# Patient Record
Sex: Female | Born: 1955 | Hispanic: Yes | Smoking: Never smoker
Health system: Southern US, Community
[De-identification: ages and names within clinical notes are randomized; demographics above are authoritative.]

## PROBLEM LIST (undated history)

## (undated) DIAGNOSIS — I10 Essential (primary) hypertension: Secondary | ICD-10-CM

## (undated) HISTORY — PX: ABDOMINAL SURGERY: SHX537

---

## 2010-01-23 ENCOUNTER — Ambulatory Visit: Payer: Self-pay | Admitting: Family

## 2010-01-27 ENCOUNTER — Ambulatory Visit: Payer: Self-pay | Admitting: Family

## 2010-08-07 ENCOUNTER — Ambulatory Visit: Payer: Self-pay | Admitting: Family

## 2013-04-03 ENCOUNTER — Ambulatory Visit: Payer: Self-pay | Admitting: Primary Care

## 2014-06-10 ENCOUNTER — Emergency Department: Payer: Self-pay | Admitting: Emergency Medicine

## 2014-06-10 LAB — URINALYSIS, COMPLETE
BILIRUBIN, UR: NEGATIVE
BLOOD: NEGATIVE
GLUCOSE, UR: NEGATIVE mg/dL (ref 0–75)
Ketone: NEGATIVE
Nitrite: NEGATIVE
PH: 5 (ref 4.5–8.0)
Protein: NEGATIVE
Specific Gravity: 1.009 (ref 1.003–1.030)
Squamous Epithelial: NONE SEEN

## 2014-06-10 LAB — CBC
HCT: 44.1 % (ref 35.0–47.0)
HGB: 14.4 g/dL (ref 12.0–16.0)
MCH: 29.4 pg (ref 26.0–34.0)
MCHC: 32.6 g/dL (ref 32.0–36.0)
MCV: 90 fL (ref 80–100)
Platelet: 289 10*3/uL (ref 150–440)
RBC: 4.9 10*6/uL (ref 3.80–5.20)
RDW: 13.6 % (ref 11.5–14.5)
WBC: 15.3 10*3/uL — ABNORMAL HIGH (ref 3.6–11.0)

## 2014-06-10 LAB — BASIC METABOLIC PANEL
ANION GAP: 8 (ref 7–16)
BUN: 15 mg/dL (ref 7–18)
CALCIUM: 9.4 mg/dL (ref 8.5–10.1)
CHLORIDE: 104 mmol/L (ref 98–107)
CO2: 26 mmol/L (ref 21–32)
Creatinine: 0.79 mg/dL (ref 0.60–1.30)
EGFR (African American): >60
GLUCOSE: 142 mg/dL — AB (ref 65–99)
Osmolality: 279 (ref 275–301)
POTASSIUM: 3.7 mmol/L (ref 3.5–5.1)
SODIUM: 138 mmol/L (ref 136–145)

## 2016-02-18 ENCOUNTER — Other Ambulatory Visit: Payer: Self-pay | Admitting: Internal Medicine

## 2016-02-18 DIAGNOSIS — Z1239 Encounter for other screening for malignant neoplasm of breast: Secondary | ICD-10-CM

## 2016-03-03 ENCOUNTER — Ambulatory Visit: Payer: BLUE CROSS/BLUE SHIELD

## 2016-03-05 ENCOUNTER — Ambulatory Visit
Admission: RE | Admit: 2016-03-05 | Discharge: 2016-03-05 | Disposition: A | Discharge status: Home / Self Care | Payer: BLUE CROSS/BLUE SHIELD | Source: Ambulatory Visit | Attending: Internal Medicine | Admitting: Internal Medicine

## 2016-03-05 DIAGNOSIS — Z1231 Encounter for screening mammogram for malignant neoplasm of breast: Secondary | ICD-10-CM | POA: Insufficient documentation

## 2016-03-05 DIAGNOSIS — Z1239 Encounter for other screening for malignant neoplasm of breast: Secondary | ICD-10-CM

## 2016-03-10 ENCOUNTER — Other Ambulatory Visit: Payer: Self-pay | Admitting: Internal Medicine

## 2016-03-10 DIAGNOSIS — R928 Other abnormal and inconclusive findings on diagnostic imaging of breast: Secondary | ICD-10-CM

## 2016-03-24 ENCOUNTER — Ambulatory Visit
Admission: RE | Admit: 2016-03-24 | Discharge: 2016-03-24 | Disposition: A | Discharge status: Home / Self Care | Payer: BLUE CROSS/BLUE SHIELD | Source: Ambulatory Visit | Attending: Internal Medicine | Admitting: Internal Medicine

## 2016-03-24 DIAGNOSIS — N6489 Other specified disorders of breast: Secondary | ICD-10-CM | POA: Diagnosis present

## 2016-03-24 DIAGNOSIS — R928 Other abnormal and inconclusive findings on diagnostic imaging of breast: Secondary | ICD-10-CM

## 2016-05-02 ENCOUNTER — Emergency Department
Admission: EM | Admit: 2016-05-02 | Discharge: 2016-05-03 | Disposition: A | Discharge status: Home / Self Care | Payer: BLUE CROSS/BLUE SHIELD | Attending: Emergency Medicine | Admitting: Emergency Medicine

## 2016-05-02 ENCOUNTER — Encounter: Payer: Self-pay | Admitting: Emergency Medicine

## 2016-05-02 DIAGNOSIS — R109 Unspecified abdominal pain: Secondary | ICD-10-CM | POA: Diagnosis not present

## 2016-05-02 DIAGNOSIS — Z79899 Other long term (current) drug therapy: Secondary | ICD-10-CM | POA: Insufficient documentation

## 2016-05-02 DIAGNOSIS — I10 Essential (primary) hypertension: Secondary | ICD-10-CM | POA: Diagnosis not present

## 2016-05-02 HISTORY — DX: Essential (primary) hypertension: I10

## 2016-05-02 NOTE — ED Notes (Addendum)
Interpreter present; pt says symptoms started around 2pm today; she vomited once around 2130; pt now says not really diarrhea, just gas; pt says abd pain is generalized colic type pain; intermittent; pt says no pain at present; when pain is at it's worst she rates it 7/10

## 2016-05-02 NOTE — ED Notes (Signed)
Pt talking with interpreter-not wanting to have blood drawn to see what could be possibly causing her symptoms; interpreter trying to explain to pt why blood work is needed; pt reluctant to have blood drawn but now understands why it's needed

## 2016-05-02 NOTE — ED Notes (Signed)
Spent several minutes discussing blood draw with pt. Pt consents to blood draw. After piercing pt's skin with needle for venipuncture, pt states "i don't want it anymore, i want to go home now." no blood work obtained, dr. Darnelle Catalanmalinda notified.

## 2016-05-02 NOTE — ED Notes (Addendum)
At patient's request, submitted request for interpreter for triage; pt presents with N/V/D and abd pain; given mask to wear

## 2016-05-03 MED ORDER — ONDANSETRON 4 MG PO TBDP
4.0000 mg | ORAL_TABLET | Freq: Once | ORAL | Status: AC
  Administered 2016-05-03: 4 mg via ORAL
  Filled 2016-05-03: qty 1

## 2016-05-03 NOTE — ED Provider Notes (Signed)
[facility] Emergency Department Provider Note   ____________________________________________  Time seen: Approximately 12:19 AM  I have reviewed the triage vital signs and the nursing notes.   HISTORY  Chief Complaint Abdominal Pain and Emesis    HPI Susan Duarte is a 60 y.o. female Comes in not wanting blood work x-rays or CT scan to talk her into getting blood work within limits at up with the IV and patient refuses againno explained to try to get blood work and the [employer] patient then provides me the history through the interpreter that she has crampy abdominal pain and vomited once, has a lot of gas and a little bit of diarrhea today. yesterday she was fine she is not running a fever she took 1 tablespoon of Pepto-Bismol and feltbetter afterwards but she is not quite back to normal. I discussed with her since her belly is not tender she is not vomiting anymore not weak or lightheaded that she can go back home take 2 more tablespoons of Pepto-Bismol and drank some water see what happens tonight. She is to return if she has worsening of her abdominal pain further vomiting diarrhea pain localizes somewhere in her abdomenshe feels weak sicker or in any way worse. Patient is okay with this still does not want an IV or CT scan I agree not to do any of these as long as she understands that she is to return if she feels worse at all and she is to expect that we will do much more including possibly CT scan and IV if she does come back.She agrees to this.     Past Medical History  Diagnosis Date  . Hypertension     There are no active problems to display for this patient.   Past Surgical History  Procedure Laterality Date  . Abdominal surgery    . Cesarean section      Current Outpatient Rx  Name  Route  Sig  Dispense  Refill  . lisinopril (PRINIVIL,ZESTRIL) 10 MG tablet   Oral   Take 10 mg by mouth daily.           Allergies Ivp dye  Family  History  Problem Relation Age of Onset  . Breast cancer Neg Hx     Social History Social History  Substance Use Topics  . Smoking status: Never Smoker   . Smokeless tobacco: None  . Alcohol Use: No    Review of Systems  Constitutional: No fever/chills Eyes: No visual changes. ENT: No sore throat. Cardiovascular: Denies chest pain. Respiratory: Denies shortness of breath. Gastrointestinal:ee history of present illness Genitourinary: Negative for dysuria. Musculoskeletal: Negative for back pain. Skin: Negative for rash. Neurological: Negative for headaches, focal weakness or numbness.  10-point ROS otherwise negative.  ____________________________________________   PHYSICAL EXAM:  VITAL SIGNS: ED Triage Vitals  Enc Vitals Group     BP 05/02/16 2315 169/77 mmHg     Pulse Rate 05/02/16 2315 99     Resp 05/02/16 2315 18     Temp 05/02/16 2315 98.5 F (36.9 C)     Temp Source 05/02/16 2315 Oral     SpO2 05/02/16 2315 97 %     Weight 05/02/16 2315 117 lb (53.071 kg)     Height 05/02/16 2315 4\' 11"  (1.499 m)     Head Cir --      Peak Flow --      Pain Score 05/02/16 2315 0     Pain Loc --  Pain Edu? --      Excl. in GC? --     Constitutional: Alert and oriented. Well appearing and in no acute distress. Eyes: Conjunctivae are normal. PERRL. EOMI. Head: Atraumatic. Nose: No congestion/rhinnorhea. Mouth/Throat: Mucous membranes are moist.  Oropharynx non-erythematous. Neck: No stridor Cardiovascular:   Good peripheral circulation. Respiratory: Normal respiratory effort.  No retractions. Gastrointestinal: Soft and nontender. No distention. No abdominal bruits. No CVA tenderness. Musculoskeletal: No lower extremity tenderness nor edema.   Neurologic:  Normal speech and language. No gross focal neurologic deficits are appreciated. No gait instability. Skin:  Skin is warm, dry and intact. No rash noted. Psychiatric: Mood and affect are normal. Speech and behavior  are normal.  ____________________________________________   LABS (all labs ordered are listed, but only abnormal results are displayed)  Labs Reviewed - No data to display ____________________________________________  EKG   ____________________________________________  RADIOLOGY   ____________________________________________   PROCEDURES    ____________________________________________   INITIAL IMPRESSION / ASSESSMENT AND PLAN / ED COURSE  Pertinent labs & imaging results that were available during my care of the patient were reviewed by me and considered in my medical decision making (see chart for details).   ____________________________________________   FINAL CLINICAL IMPRESSION(S) / ED DIAGNOSES  Final diagnoses:  Abdominal pain, unspecified abdominal location      NEW MEDICATIONS STARTED DURING THIS VISIT:  Discharge Medication List as of 05/03/2016 12:25 AM       Note:  This document was prepared using Dragon voice recognition software and may include unintentional dictation errors.    Arnaldo NatalPaul F Malinda, MD 05/03/16 [phone]

## 2016-05-03 NOTE — Discharge Instructions (Signed)
Dolor abdominal en adultos (Abdominal Pain, Adult) El dolor puede tener muchas causas. Normalmente la causa del dolor abdominal no es una enfermedad y Scientist, clinical (histocompatibility and immunogenetics)mejorar sin TEFL teachertratamiento. Frecuentemente puede controlarse y tratarse en casa. Su mdico le Medical sales representativerealizar un examen fsico y posiblemente solicite anlisis de sangre y radiografas para ayudar a Chief Strategy Officerdeterminar la gravedad de su dolor. Sin embargo, en IAC/InterActiveCorpmuchos casos, debe transcurrir ms tiempo antes de que se pueda Clinical research associateencontrar una causa evidente del dolor. Antes de llegar a ese punto, es posible que su mdico no sepa si necesita ms pruebas o un tratamiento ms profundo. INSTRUCCIONES PARA EL CUIDADO EN EL HOGAR  Est atento al dolor para ver si hay cambios. Las siguientes indicaciones ayudarn a Architectural technologistaliviar cualquier molestia que pueda sentir:  Bramanome solo medicamentos de venta libre o recetados, segn las indicaciones del mdico.  No tome laxantes a menos que se lo haya indicado su mdico.  Pruebe con Neomia Dearuna dieta lquida absoluta (caldo, t o agua) segn se lo indique su mdico. Introduzca gradualmente una dieta normal, segn su tolerancia. SOLICITE ATENCIN MDICA SI:  Tiene dolor abdominal sin explicacin.  Tiene dolor abdominal relacionado con nuseas o diarrea.  Tiene dolor cuando orina o defeca.  Experimenta dolor abdominal que lo despierta de noche.  Tiene dolor abdominal que empeora o mejora cuando come alimentos.  Tiene dolor abdominal que empeora cuando come alimentos grasosos.  Tiene fiebre. SOLICITE ATENCIN MDICA DE INMEDIATO SI:   El dolor no desaparece en un plazo mximo de 2horas.  No deja de (vomitar).  El Engineer, miningdolor se siente solo en partes del abdomen, como el lado derecho o la parte inferior izquierda del abdomen.  Evaca materia fecal sanguinolenta o negra, de aspecto alquitranado. ASEGRESE DE QUE:  Comprende estas instrucciones.  Controlar su afeccin.  Recibir ayuda de inmediato si no mejora o si empeora.   Esta  informacin no tiene Theme park managercomo fin reemplazar el consejo del mdico. Asegrese de hacerle al mdico cualquier pregunta que tenga.   Document Released: 10/18/2005 Document Revised: 11/08/2014 Elsevier Interactive Patient Education [employer]2016 [employer] Inc.   Please take 2 more tablespoons of Pepto-Bismol at home. Make sure you drink a little bit of water afterwards to make it Pepto-Bismol out of your mouth. Remember the Pepto-Bismol can make your stool black. Please return for fever or vomiting worse pain or localization of the pain to any one part of the abdomen area please return if you feel worse at all. Remember if she do come back we will need to get more tests and chest physical exam.

## 2017-04-20 ENCOUNTER — Other Ambulatory Visit: Payer: Self-pay | Admitting: Obstetrics and Gynecology

## 2017-04-20 DIAGNOSIS — N6001 Solitary cyst of right breast: Secondary | ICD-10-CM

## 2017-05-27 ENCOUNTER — Ambulatory Visit
Admission: RE | Admit: 2017-05-27 | Discharge: 2017-05-27 | Disposition: A | Discharge status: Home / Self Care | Payer: BLUE CROSS/BLUE SHIELD | Source: Ambulatory Visit | Attending: Obstetrics and Gynecology | Admitting: Obstetrics and Gynecology

## 2017-05-27 DIAGNOSIS — N6001 Solitary cyst of right breast: Secondary | ICD-10-CM

## 2021-07-07 ENCOUNTER — Other Ambulatory Visit: Payer: Self-pay | Admitting: Internal Medicine

## 2021-07-07 DIAGNOSIS — Z1231 Encounter for screening mammogram for malignant neoplasm of breast: Secondary | ICD-10-CM

## 2021-08-05 LAB — COLOGUARD

## 2022-02-12 ENCOUNTER — Other Ambulatory Visit: Payer: Self-pay | Admitting: Internal Medicine

## 2022-02-12 DIAGNOSIS — Z1231 Encounter for screening mammogram for malignant neoplasm of breast: Secondary | ICD-10-CM

## 2022-04-02 ENCOUNTER — Ambulatory Visit
Admission: RE | Admit: 2022-04-02 | Discharge: 2022-04-02 | Disposition: A | Discharge status: Home / Self Care | Payer: Medicare Other | Source: Ambulatory Visit | Attending: Internal Medicine | Admitting: Internal Medicine

## 2022-04-02 DIAGNOSIS — Z1231 Encounter for screening mammogram for malignant neoplasm of breast: Secondary | ICD-10-CM | POA: Diagnosis not present

## 2024-02-03 DIAGNOSIS — E119 Type 2 diabetes mellitus without complications: Secondary | ICD-10-CM | POA: Diagnosis not present

## 2024-02-10 DIAGNOSIS — I10 Essential (primary) hypertension: Secondary | ICD-10-CM | POA: Diagnosis not present

## 2024-02-10 DIAGNOSIS — E7800 Pure hypercholesterolemia, unspecified: Secondary | ICD-10-CM | POA: Diagnosis not present

## 2024-02-10 DIAGNOSIS — E119 Type 2 diabetes mellitus without complications: Secondary | ICD-10-CM | POA: Diagnosis not present

## 2024-02-10 IMAGING — MG MM DIGITAL SCREENING BILAT W/ TOMO AND CAD
8 series · 8 of 24 positions shown · non-contrast
Comparison: Previous exam(s).

CLINICAL DATA: Screening.

EXAM:
DIGITAL SCREENING BILATERAL MAMMOGRAM WITH TOMOSYNTHESIS AND CAD
TECHNIQUE: Bilateral screening digital craniocaudal and mediolateral oblique
mammograms were obtained. Bilateral screening digital breast
tomosynthesis was performed. The images were evaluated with
computer-aided detection.

[L CC synth-2D]
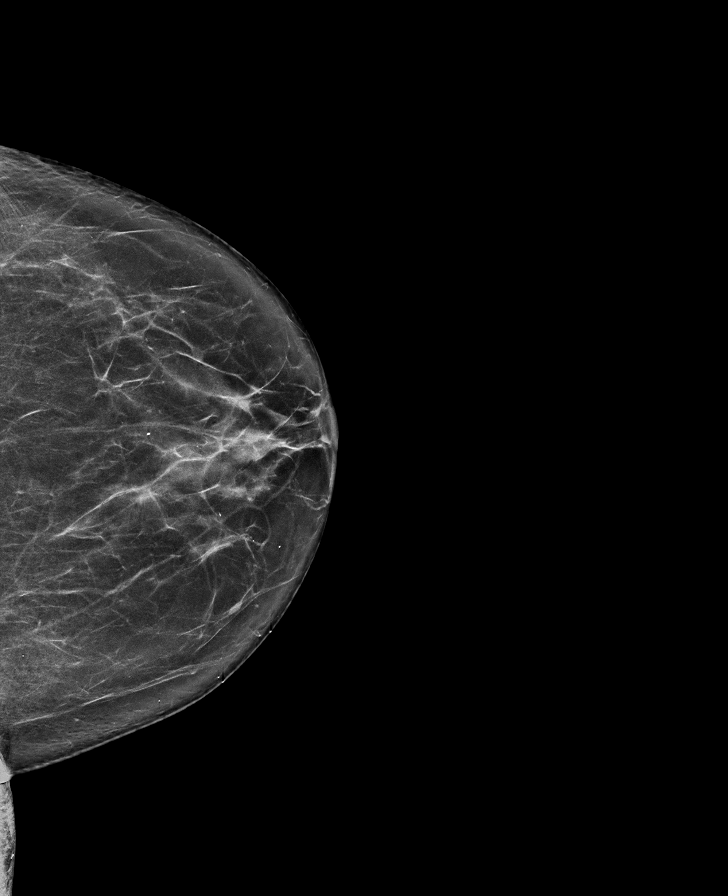

[R CC synth-2D]
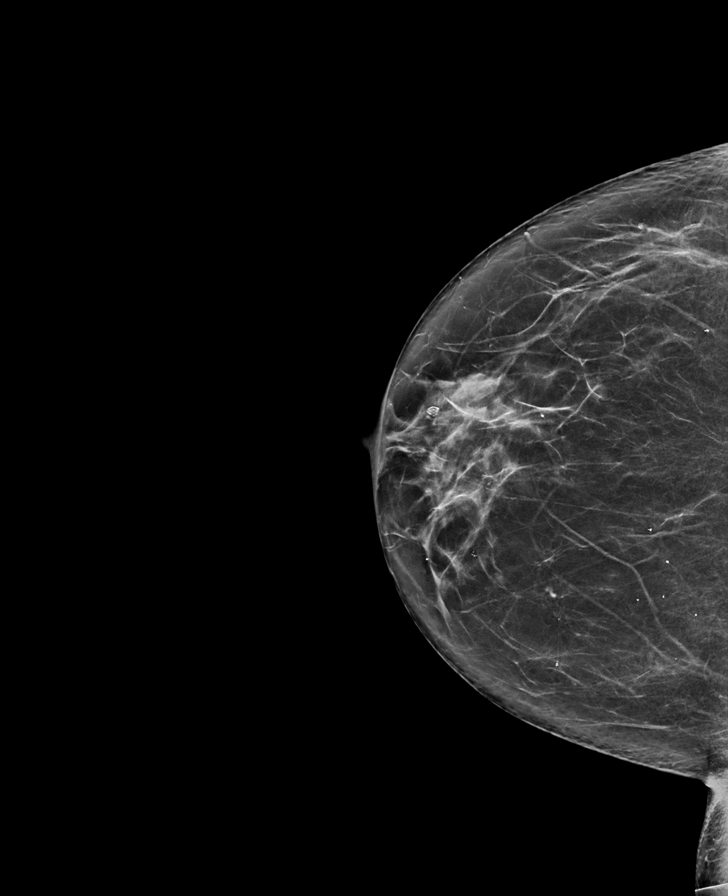

[R MLO synth-2D]
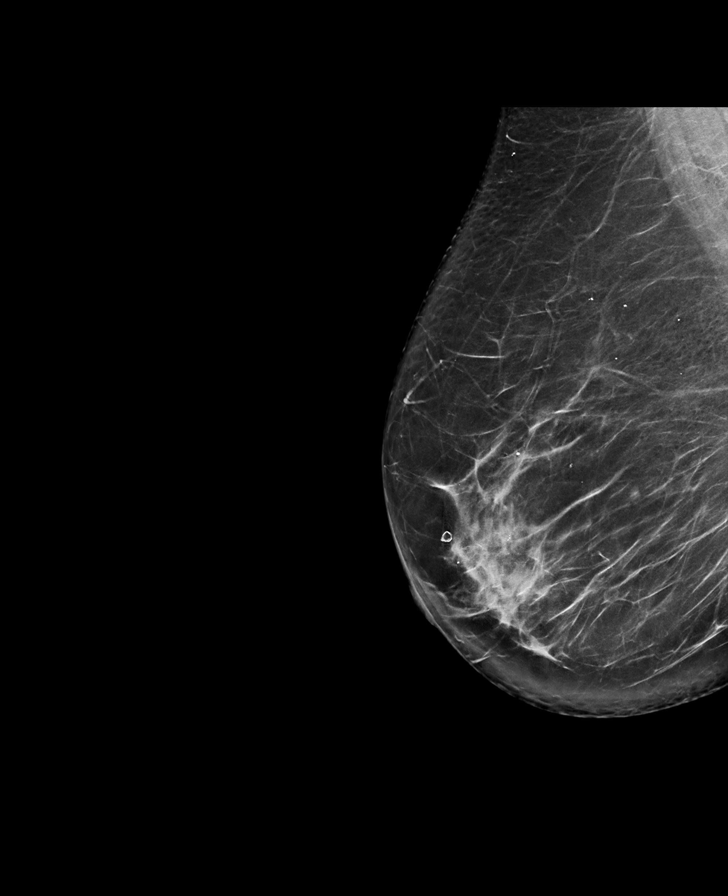

[L MLO synth-2D]
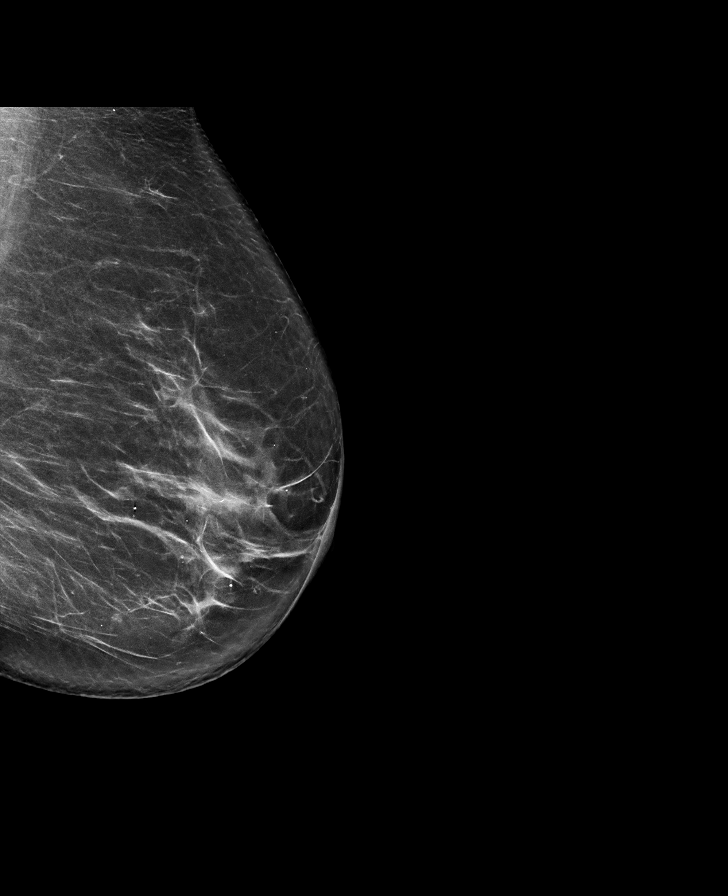

[R MLO tomo · tomo slice 41/82.0]
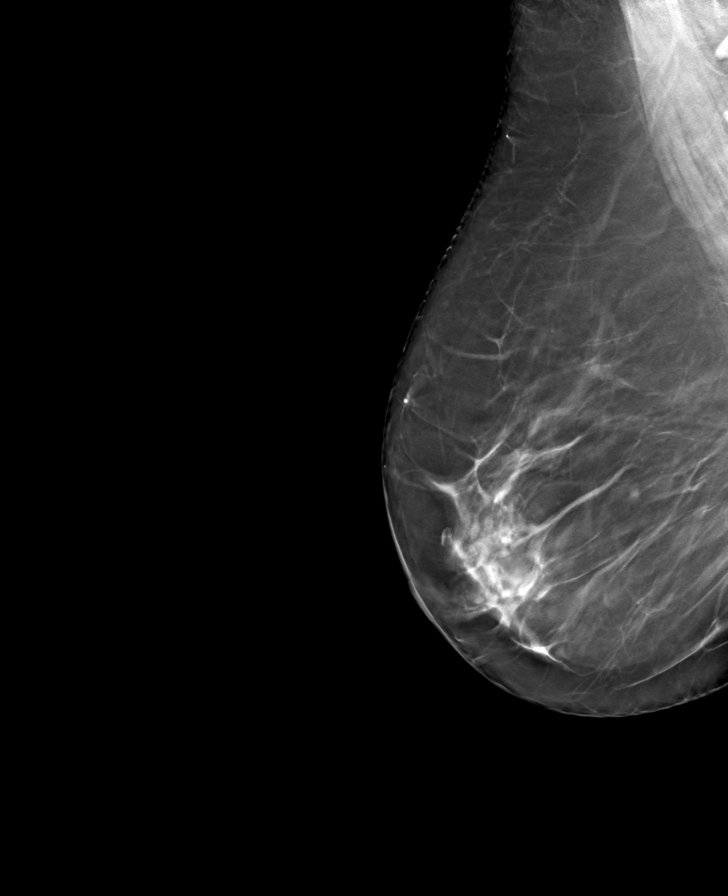

[L MLO tomo · tomo slice 40/79.0]
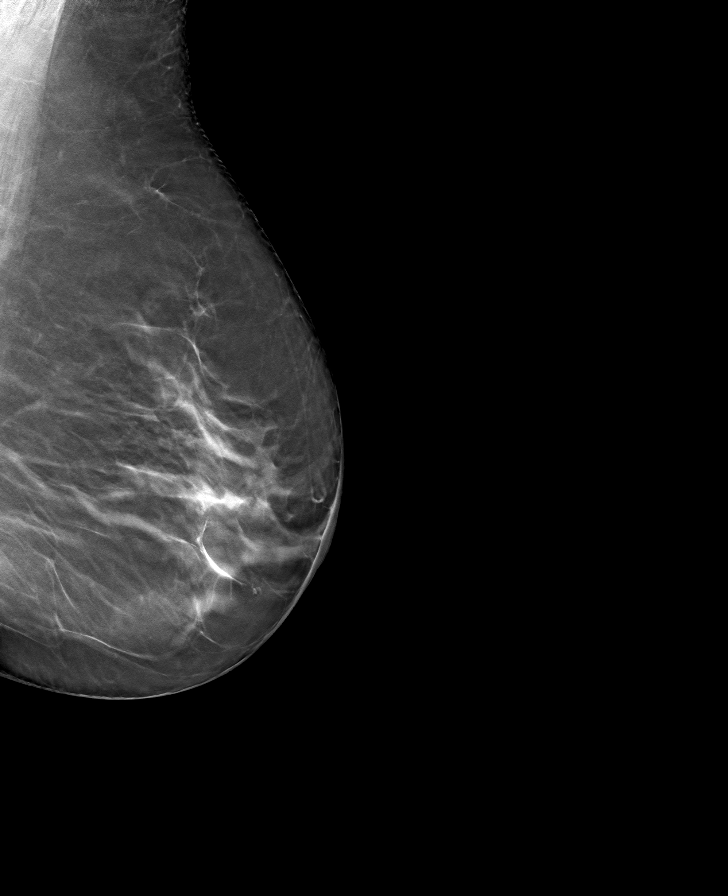

[R CC tomo · tomo slice 37/74.0]
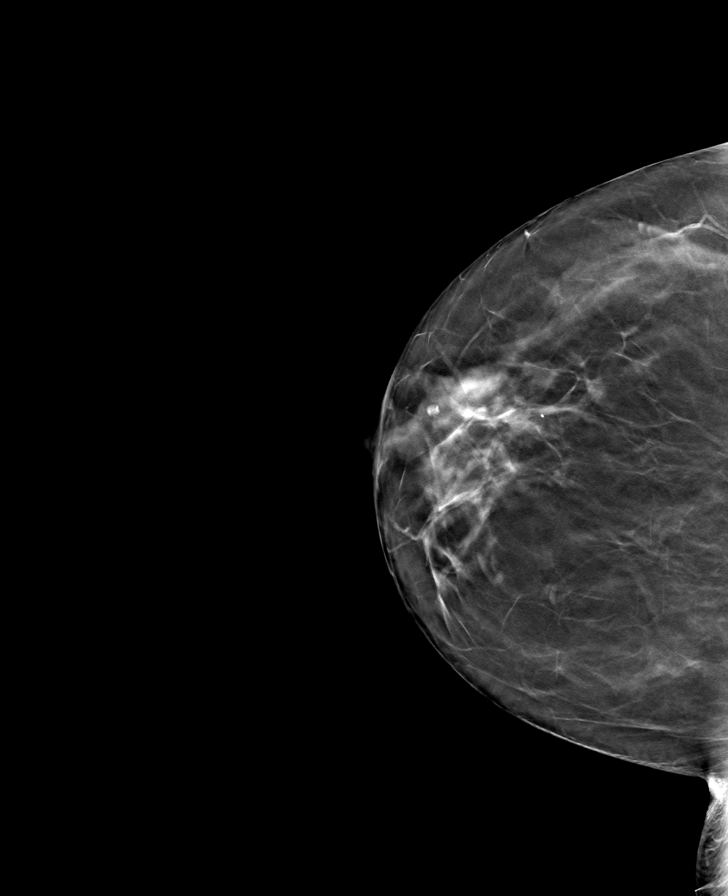

[L CC tomo · tomo slice 37/74.0]
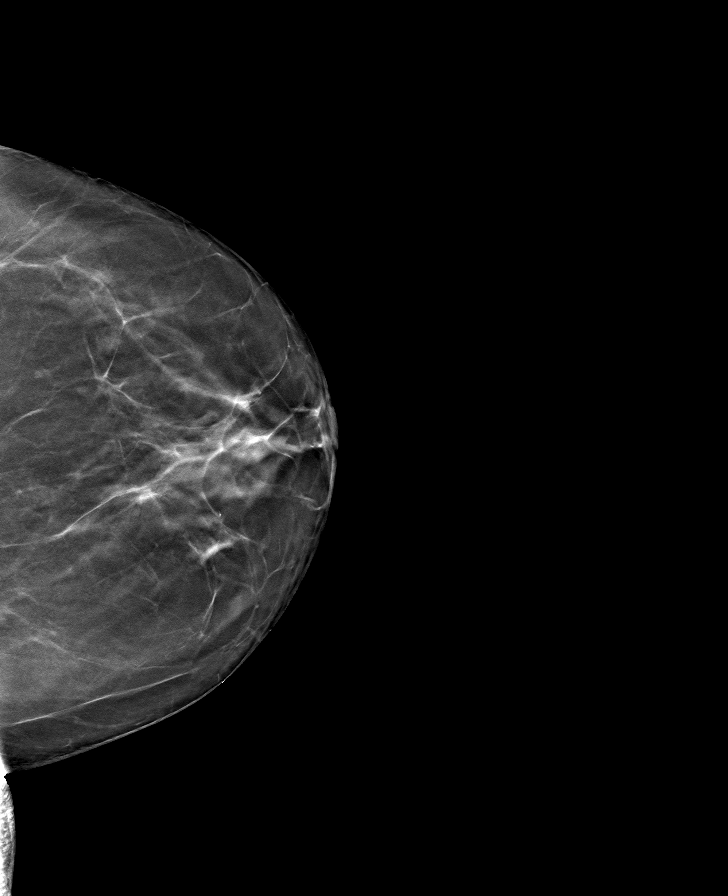

[8 of 24 positions shown; findings below may reference images not displayed]

ACR Breast Density Category b: There are scattered areas of
fibroglandular density.
FINDINGS: There are no findings suspicious for malignancy.
IMPRESSION: No mammographic evidence of malignancy. A result letter of this
screening mammogram will be mailed directly to the patient.

RECOMMENDATION:
Screening mammogram in one year. (Code:51-O-LD2)

BI-RADS CATEGORY  1: Negative.

## 2024-10-02 ENCOUNTER — Other Ambulatory Visit: Payer: Self-pay | Admitting: Internal Medicine

## 2024-10-02 DIAGNOSIS — Z1231 Encounter for screening mammogram for malignant neoplasm of breast: Secondary | ICD-10-CM
